# Patient Record
Sex: Male | Born: 1995 | Race: Black or African American | Hispanic: No | Marital: Single | Smoking: Former smoker
Health system: Southern US, Community
[De-identification: ages and names within clinical notes are randomized; demographics above are authoritative.]

---
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
## 2015-02-23 ENCOUNTER — Emergency Department
Admission: EM | Admit: 2015-02-23 | Discharge: 2015-02-23 | Disposition: A | Discharge status: Home / Self Care | Payer: Self-pay

## 2015-02-23 NOTE — ED Notes (Signed)
Called in waiting room with no answer 

## 2015-06-26 ENCOUNTER — Encounter (HOSPITAL_COMMUNITY): Payer: Self-pay | Admitting: Emergency Medicine

## 2015-06-26 ENCOUNTER — Emergency Department (HOSPITAL_COMMUNITY)
Admission: EM | Admit: 2015-06-26 | Discharge: 2015-06-26 | Disposition: A | Discharge status: Home / Self Care | Payer: 59 | Attending: Physician Assistant | Admitting: Physician Assistant

## 2015-06-26 DIAGNOSIS — L02416 Cutaneous abscess of left lower limb: Secondary | ICD-10-CM | POA: Diagnosis not present

## 2015-06-26 DIAGNOSIS — F17200 Nicotine dependence, unspecified, uncomplicated: Secondary | ICD-10-CM | POA: Insufficient documentation

## 2015-06-26 DIAGNOSIS — L0291 Cutaneous abscess, unspecified: Secondary | ICD-10-CM

## 2015-06-26 DIAGNOSIS — M25552 Pain in left hip: Secondary | ICD-10-CM | POA: Diagnosis present

## 2015-06-26 MED ORDER — DOXYCYCLINE HYCLATE 100 MG PO CAPS: 100.0000 mg | ORAL_CAPSULE | Freq: Two times a day (BID) | ORAL | Status: DC

## 2015-06-26 MED ORDER — IBUPROFEN 800 MG PO TABS: 800.0000 mg | ORAL_TABLET | Freq: Three times a day (TID) | ORAL | Status: DC

## 2015-06-26 MED ORDER — DOXYCYCLINE HYCLATE 100 MG PO TABS
100.0000 mg | ORAL_TABLET | Freq: Once | ORAL | Status: AC
  Administered 2015-06-26: 100 mg via ORAL
  Filled 2015-06-26: qty 1

## 2015-06-26 NOTE — ED Provider Notes (Signed)
CSN: [id number]     Arrival date & time 06/26/15  1452 History  By signing my name below, I, Placido Sou, attest that this documentation has been prepared under the direction and in the presence of Blayke Pinera C. Shawntell Dixson, PA-C. Electronically Signed: Placido Sou, ED Scribe. 06/26/2015. 5:33 PM.    Chief Complaint  Patient presents with  . Abscess   The history is provided by the patient. No language interpreter was used.    HPI Comments: Jeremiah Smith is a 20 y.o. male who presents to the Emergency Department complaining of a small abscess with pain and irritation on his left lateral hip onset 4 days ago. Pt notes his wound came to a head and he successfully drained it reporting a white/yellow discharge. He reports associated mild redness and pain to the affected region which has been gradually alleviating. Pt rates his pain at 3/10, throbbing, nonradiating. He reports cleaning the wound with antibacterial soap. Patient denies fever/chills, nausea/vomiting, difficulty ambulating, or any other complaints.  History reviewed. No pertinent past medical history. History reviewed. No pertinent past surgical history. No family history on file. Social History  Substance Use Topics  . Smoking status: Current Every Day Smoker  . Smokeless tobacco: None  . Alcohol Use: No    Review of Systems  Constitutional: Negative for fever and chills.  Gastrointestinal: Negative for nausea and vomiting.  Skin: Positive for color change and wound.    Allergies  Review of patient's allergies indicates not on file.  Home Medications   Prior to Admission medications   Medication Sig Start Date End Date Taking? Authorizing Provider  doxycycline (VIBRAMYCIN) 100 MG capsule Take 1 capsule (100 mg total) by mouth 2 (two) times daily. 06/26/15   Soren Lazarz C Cortez Flippen, PA-C  ibuprofen (ADVIL,MOTRIN) 800 MG tablet Take 1 tablet (800 mg total) by mouth 3 (three) times daily. 06/26/15   Kateena Degroote C Rodney Wigger, PA-C   BP 111/64 mmHg   Pulse 64  Temp(Src) 98.3 F (36.8 C) (Oral)  Resp 16  SpO2 99%    Physical Exam  Constitutional: He is oriented to person, place, and time. He appears well-developed and well-nourished.  HENT:  Head: Normocephalic and atraumatic.  Eyes: Conjunctivae are normal.  Neck: Normal range of motion. Neck supple.  Cardiovascular: Normal rate.   Pulmonary/Chest: Effort normal. No respiratory distress.  Musculoskeletal: Normal range of motion.  Neurological: He is alert and oriented to person, place, and time.  Skin: Skin is warm and dry. There is erythema.  4 cm circular area of erythema and induration on the left lateral hip; no drainage noted or able to be expressed; center of the wound is open with granulation tissue forming in the base of the wound.  Psychiatric: He has a normal mood and affect.  Nursing note and vitals reviewed.   ED Course  Procedures   EMERGENCY DEPARTMENT US SOFT TISSUE INTERPRETATION "Study: Limited Ultrasound of the noted body part in comments below"  INDICATIONS: Soft tissue infection Multiple views of the body part are obtained with a multi-frequency linear probe  PERFORMED BY:  Myself  IMAGES ARCHIVED?: No  SIDE:Left  BODY PART:Lower extremity  FINDINGS: No abcess noted and Cellulitis present  LIMITATIONS: None  INTERPRETATION:  No abcess noted and Cellulitis present  COMMENT:  Depth is superficial. Evidence of cellulitis is present, but minimal.  DIAGNOSTIC STUDIES: Oxygen Saturation is 100% on RA, normal by my interpretation.    COORDINATION OF CARE: 5:30 PM Discussed next steps with pt.  He understood and is agreeable with the plan.   Labs Review Labs Reviewed - No data to display  Imaging Review No results found.   EKG Interpretation None      MDM   Final diagnoses:  Abscess    Jeremiah Smith presents with a small abscess on his left lateral hip for the last 4 days.  This patient's abscess has orally been opened and  drained. No area of fluctuance that would benefit from I&D. Patient placed on an antibiotic to assist healing. Patient is nontoxic appearing, afebrile, not tachycardic on my exam, not tachypneic, and is in no apparent distress. Patient has no signs of sepsis or other serious or life-threatening condition. Suspect that the patient's first pulse was erroneous finding. The patient was given instructions for home care as well as return precautions. Patient voices understanding of these instructions, accepts the plan, and is comfortable with discharge.  Filed Vitals:   06/26/15 1504 06/26/15 1804  BP: 124/67 111/64  Pulse: 114 64  Temp: 98.3 F (36.8 C)   TempSrc: Oral   Resp: 18 16  SpO2: 100% 99%     I personally performed the services described in this documentation, which was scribed in my presence. The recorded information has been reviewed and is accurate.   Anselm Pancoast, PA-C 06/26/15 1946  Courteney Randall An, MD 06/26/15 2246

## 2015-06-26 NOTE — ED Notes (Signed)
Per pt, states abscess on left thigh-noticed it on Monday-states he tried to drain it-states now draining puss

## 2015-06-26 NOTE — Discharge Instructions (Signed)
You have been seen today for an abscess. Abscess seems to be draining well on its own. You have no signs of systemic infection. Please take all of your antibiotics until finished!   You may develop abdominal discomfort or diarrhea from the antibiotic.  You may help offset this with probiotics which you can buy or get in yogurt. Do not eat or take the probiotics until 2 hours after your antibiotic. Follow up with PCP as needed. Return to ED should symptoms worsen.   Emergency Department Resource Guide 1) Find a Doctor and Pay Out of Pocket Although you won't have to find out who is covered by your insurance plan, it is a good idea to ask around and get recommendations. You will then need to call the office and see if the doctor you have chosen will accept you as a new patient and what types of options they offer for patients who are self-pay. Some doctors offer discounts or will set up payment plans for their patients who do not have insurance, but you will need to ask so you aren't surprised when you get to your appointment.  2) Contact Your Local Health Department Not all health departments have doctors that can see patients for sick visits, but many do, so it is worth a call to see if yours does. If you don't know where your local health department is, you can check in your phone book. The CDC also has a tool to help you locate your state's health department, and many state websites also have listings of all of their local health departments.  3) Find a Walk-in Clinic If your illness is not likely to be very severe or complicated, you may want to try a walk in clinic. These are popping up all over the country in pharmacies, drugstores, and shopping centers. They're usually staffed by nurse practitioners or physician assistants that have been trained to treat common illnesses and complaints. They're usually fairly quick and inexpensive. However, if you have serious medical issues or chronic medical  problems, these are probably not your best option.  No Primary Care Doctor: - Call Health Connect at  [phone] - they can help you locate a primary care doctor that  accepts your insurance, provides certain services, etc. - Physician Referral Service- [phone]  Chronic Pain Problems: Organization         Address  Phone   Notes  [facility]  [phone] Patients need to be referred by their primary care doctor.   Medication Assistance: Organization         Address  Phone   Notes  [facility] Medication [facility] [address] [place], [place] [address] [phone] --Must be a resident of [facility] -- Must have NO insurance coverage whatsoever (no Medicaid/ Medicare, etc.) -- The pt. MUST have a primary care doctor that directs their care regularly and follows them in the community   [facility]ist  [phone]   [employer]  [phone]    Agencies that provide inexpensive medical care: Organization         Address  Phone   Notes  [facility]  [phone]   [facility]    [phone]   [facility] [address] [place], [place] [address] [phone]   [facility] [address] [place] [place] [phone]   Planned Parenthood    [phone]  [facility]    [phone]   [facility]  [address], [place] Phone:  [phone], Fax:  [phone] Hours of Operation:  9 am - 6 pm, M-F.  Also accepts Medicaid/Medicare and self-pay.  [facility]  [address], [place] Phone: [phone], Fax: [phone]. Hours of Operation:  8:30 am - 5:30 pm, M-F.  Also accepts Medicaid and self-pay.  [facility] [place] [address] [place] Phone: [phone]   [facility] [address] Natasha Bence [place], [place] [phone], Ext. [phone]  Mondays & Thursdays: 7-9 AM.  First 15 patients are seen on a first come, first serve basis.    Medicaid-accepting [facility] Providers:  Organization         Address  Phone   Notes  [facility] [address], [place] [phone] Also accepts self-pay patients.  [facility] [address] Laurell Josephs [place], [place]  [phone]   [facility] [address], [place] [phone]   [facility] [address] [place] [phone]   Renaye Rakers [address], [place]   [phone] Only accepts [place] Access [place] patients after they have their name applied to their card.   Self-Pay (no insurance) in [facility]:  Organization         Address  Phone   Notes  Sickle Cell Patients, [facility] Medicine [address] [place], [place] [phone]   [facility] [address] [place], [place] [phone]   [facility] [place]  [address], [place] [phone]   [facility]/Dr. Osei-Bonsu  [address], Home or [address], [place] [phone] Phone number for both [place] and [place] locations is the same.  [facility] [address], [place] [phone]   [facility] [address] [place] or [address] [phone]   [facility] [address] [place] [phone], phone; [phone], fax Sees patients 1st and 3rd Saturday of every month.  Must not qualify for public or private insurance (i.e. Medicaid, Medicare, [place] Health Choice, Veterans' Benefits)  Household income should be no more than 200% of the poverty level The clinic cannot treat you if you are pregnant or think you are pregnant  Sexually transmitted diseases are not treated at  the clinic.    Dental Care: Organization         Address  Phone  Notes  [facility] of [facility] [address] [place], [place] [phone] Accepts children up to age 74 who are enrolled in [place] or [place] Health Choice; pregnant women with a Medicaid card; and children who have applied for Medicaid or [place] Health Choice, but were declined, whose parents can pay a reduced fee at time of service.  [facility] Department of [facility]  [address], [place] [phone] Accepts children up to age 50 who are enrolled in [place] or [place] Health Choice; pregnant women with a Medicaid card; and children who have applied for Medicaid or [place] Health Choice, but were declined, whose parents can pay a reduced fee at time  of service.  [facility]  [address], [place] [phone] Patients are seen by appointment only. Walk-ins are not accepted. [facility] will see patients 10518 years of age and older. Monday - Tuesday (8am-5pm) Most Wednesdays (8:30-5pm) $30 per visit, cash only  [facility]  [address], [facility] [phone] Patients are seen by appointment only. Walk-ins are not accepted. [facility] will see patients 20 years of age and older. One Wednesday Evening (Monthly: Volunteer Based).  $30 per visit, cash only  [employer] [facility]  [phone] for adults; Children under age 614, call Graduate Pediatric Dentistry at [phone]. Children aged 634-14, please call [phone]) [phone] to request a pediatric application.  Dental services are provided in all areas of dental care including fillings, crowns and bridges, complete and partial dentures, implants, gum treatment, root canals, and extractions. Preventive care is also provided. Treatment is provided to both adults and children. Patients are selected via a lottery and there is often a  waiting list.   [facility] [address], [place]  [phone] [email or web address]   [facility] [address], [place], [place] [phone], Ext. [phone] Second and Fourth Thursday of each month, opens at 6:30 AM; Clinic ends at 9 AM.  Patients are seen on a first-come first-served basis, and a limited number are seen during each clinic.   [facility]  [address], [place], [place] [phone]   Eligibility Requirements You must have lived in [place], [place], or [place] counties for at least the last three months.   You cannot be eligible for state or federal sponsored [place]healthcare insurance, including CIGNAVeterans Administration, [place]icaid, or [employer]icare.   You generally cannot be eligible for healthcare insurance through your employer.    How to apply: Eligibility screenings are held every Tuesday and Wednesday afternoon from 1:00 pm until 4:00 pm. You do not need an appointment for the interview!  [facility] [address], [place], [place] [phone]   [facility]  [phone]   [facility]  [phone]   [facility]  [phone]    Behavioral Health Resources in the Community: Intensive Outpatient Programs Organization         Address  Phone  Notes  [facility] [address], [place], [place] [phone]   [facility] [address], [place], [place] [phone]   [facility] [address], [place], [place]  [phone]   [facility] [address],  [place], [place] [phone] or [phone]   Substance Abuse Resources Organization         Address  Phone  Notes  [facility] Drug Services  [phone]   [facility]  [phone]   [facility]  [phone]   [facility]  [phone]   Residential & Outpatient Substance Abuse  Program  [phone]   Psychological Services Organization         Address  Phone  Notes  [facility]  [phone]   [facility]  [phone]- [phone]   [facility] [address], [place] [phone] or [phone]    Mobile Crisis Teams Organization         Address  Phone  Notes  [facility], Mobile Crisis Care Unit  [phone]   Assertive Psychotherapeutic  Services  [address] [place], [place]   [facility] [address], [place] [phone]    Self-Help/Support Groups Organization         Address  Phone             Notes  [place]. of [place] - variety of support groups  [place] Call for more information  Narcotics Anonymous (NA), [facility] [address], [employer] [place]  2 meetings at this location   Special educational needs teacher         Address  Phone  Notes  [facility]idential [facility] [place],    [place]  [phone]   [facility]  [address] [place] [address]48016, [place], [place]   [place], [place] [phone] Admissions: 8am-3pm M-F  Incentives Substance [place] [address]    [place], [place] [phone]   [facility] [address] [place], [place], [place]   [facility] [address],  [place], [place]   [facility] - Intensive Outpatient [address] Kristeen Mans [address], [place], [place]   [facility] [place].,  [place], [place] [phone] or [phone]   [facility] ([facility]) [address] [place], [place] Accepts Medicaid  [facility] [address],  [place] [phone] Substance Abuse/Addiction Treatment   [facility] Organization          Address  Phone  Notes  [facility]  [phone]   Domenic Schwab, PhD [address] Arlis [place], [place]   [phone] or [phone]   [place], [place] [phone]   [facility] [address], [place], [place] [phone] Insurance/Medicaid/sponsorship through [facility] [address] [place]                                    [place], [place] [phone] [place] [address], [place] [phone]    Dr. Adele Schilder  [phone]   [facility]. 1) [address] [place] 2) [place] 3)  [place] [address], [place] [phone]  [phone]   [place] [phone] or [phone] (After Hours)

## 2016-04-23 ENCOUNTER — Emergency Department (HOSPITAL_COMMUNITY)
Admission: EM | Admit: 2016-04-23 | Discharge: 2016-04-23 | Disposition: A | Discharge status: Home / Self Care | Payer: 59 | Attending: Emergency Medicine | Admitting: Emergency Medicine

## 2016-04-23 ENCOUNTER — Encounter (HOSPITAL_COMMUNITY): Payer: Self-pay | Admitting: Emergency Medicine

## 2016-04-23 DIAGNOSIS — Z79899 Other long term (current) drug therapy: Secondary | ICD-10-CM | POA: Insufficient documentation

## 2016-04-23 DIAGNOSIS — J028 Acute pharyngitis due to other specified organisms: Secondary | ICD-10-CM | POA: Diagnosis not present

## 2016-04-23 DIAGNOSIS — J029 Acute pharyngitis, unspecified: Secondary | ICD-10-CM | POA: Insufficient documentation

## 2016-04-23 DIAGNOSIS — Z87891 Personal history of nicotine dependence: Secondary | ICD-10-CM | POA: Insufficient documentation

## 2016-04-23 LAB — RAPID STREP SCREEN (MED CTR MEBANE ONLY): Streptococcus, Group A Screen (Direct): NEGATIVE

## 2016-04-23 NOTE — Discharge Instructions (Signed)
Please read attached information. If you experience any new or worsening signs or symptoms please return to the emergency room for evaluation. Please follow-up with your primary care provider or specialist as discussed.  °

## 2016-04-23 NOTE — ED Provider Notes (Signed)
[facility]-EMERGENCY DEPT Provider Note   CSN: [id number] Arrival date & time: 04/23/16  1455  By signing my name below, I, Vista Minkobert Ross, attest that this documentation has been prepared under the direction and in the presence of H&R BlockJeffrey Clarice Bonaventure PA-C.  Electronically Signed: Vista Minkobert Ross, ED Scribe. 04/23/16. 3:38 PM.   History   Chief Complaint Chief Complaint  Patient presents with  . Sore Throat  . Chills    HPI HPI Comments: Jeremiah Smith is a 20 y.o. male who presents to the Emergency Department complaining of sore throat and chills with associated pain while swallowing that started yesterday. Pt reports a burning sensation in the back of his throat with exacerbating pain when swallowing. He also reports multiple episodes of nausea and vomiting today. Pt repots nausea following and food or drink other than water. He took Tylenol after he started to feel febrile and reports mild relief of symptoms, last dose of Tylenol at 1330. Pt denies any rhinorrhea, congestion or cough.  The history is provided by the patient. No language interpreter was used.    History reviewed. No pertinent past medical history.  There are no active problems to display for this patient.   History reviewed. No pertinent surgical history.     Home Medications    Prior to Admission medications   Medication Sig Start Date End Date Taking? Authorizing Provider  doxycycline (VIBRAMYCIN) 100 MG capsule Take 1 capsule (100 mg total) by mouth 2 (two) times daily. 06/26/15   Shawn C Joy, PA-C  ibuprofen (ADVIL,MOTRIN) 800 MG tablet Take 1 tablet (800 mg total) by mouth 3 (three) times daily. 06/26/15   Anselm PancoastShawn C Joy, PA-C    Family History Family History  Problem Relation Age of Onset  . Cancer Father   . Diabetes Father   . Hypertension Father     Social History Social History  Substance Use Topics  . Smoking status: Former Smoker    Types: Cigarettes  . Smokeless tobacco: Never Used  . Alcohol use  Yes     Comment: occ     Allergies   Patient has no known allergies.   Review of Systems Review of Systems A complete 10 system review of systems was obtained and all systems are negative except as noted in the HPI and PMH.    Physical Exam Updated Vital Signs BP 135/68 (BP Location: Right Arm)   Pulse 85   Temp 99.6 F (37.6 C) (Oral)   Resp 18   Wt 200 lb (90.7 kg)   SpO2 95%   Physical Exam  Constitutional: He is oriented to person, place, and time. He appears well-developed and well-nourished. No distress.  HENT:  Head: Normocephalic and atraumatic.  Mouth/Throat: No oropharyngeal exudate.  Mild erythema to posterior oropharynx and tonsils. Bilateral tonsialr enlargement, symmetrical, no exudate, no uvula swelling. No pooling of secretions.   Neck: Normal range of motion.  Neck is supple. Bilateral anterior cervical lymphadenopathy.  Pulmonary/Chest: Effort normal and breath sounds normal. No respiratory distress. He has no wheezes. He has no rales.  Lymphadenopathy:    He has cervical adenopathy.  Neurological: He is alert and oriented to person, place, and time.  Skin: Skin is warm and dry. He is not diaphoretic.  Psychiatric: He has a normal mood and affect. Judgment normal.  Nursing note and vitals reviewed.    ED Treatments / Results  DIAGNOSTIC STUDIES: Oxygen Saturation is 95% on RA, normal by my interpretation.  COORDINATION OF CARE: 3:36  PM-Discussed treatment plan with pt at bedside and pt agreed to plan.   Labs (all labs ordered are listed, but only abnormal results are displayed) Labs Reviewed  R[facility]ID ST[facility]CREEN (NOT [facility])  CULTURE, GROUP A STREP [facility])    EKG  EKG Interpretation None       Radiology No results found.  Procedures Procedures (including critical care time)  Medications Ordered in ED Medications - No data to display   Initial Impression / Assessment and Plan / ED Course  I have reviewed the triage vital signs  and the nursing notes.  Pertinent labs & imaging results that were available during my care of the patient were reviewed by me and considered in my medical decision making (see chart for details).  Clinical Course     Patient presents with likely viral pharyngitis. He has signs of minor pharyngitis, he has no swelling or edema they would be concerning for PTA or RPA. Patient be discharged home with symptomatic care instructions and strict impressions.  Final Clinical Impressions(s) / ED Diagnoses   Final diagnoses:  Viral pharyngitis    New Prescriptions Discharge Medication List as of 04/23/2016  4:06 PM    I personally performed the services described in this documentation, which was scribed in my presence. The recorded information has been reviewed and is accurate.     Eyvonne MechanicJeffrey Haydon Dorris, PA-C 04/23/16 2016    Donnetta HutchingBrian Cook, MD 04/29/16 [phone]

## 2016-04-23 NOTE — ED Triage Notes (Signed)
Pt c/o  Sore throat, chills and gagging x 24 hours. Pt reports fever , treated with Tylenol. Last dosage 1330. Pt reports throat pain and intermittent vomitting. Denies cough

## 2016-04-26 LAB — CULTURE, GROUP A STREP (THRC)

## 2018-01-11 ENCOUNTER — Other Ambulatory Visit: Payer: Self-pay

## 2018-01-11 ENCOUNTER — Encounter (HOSPITAL_COMMUNITY): Payer: Self-pay | Admitting: Emergency Medicine

## 2018-01-11 ENCOUNTER — Emergency Department (HOSPITAL_COMMUNITY)
Admission: EM | Admit: 2018-01-11 | Discharge: 2018-01-12 | Disposition: A | Discharge status: Home / Self Care | Payer: 59 | Attending: Emergency Medicine | Admitting: Emergency Medicine

## 2018-01-11 DIAGNOSIS — R1110 Vomiting, unspecified: Secondary | ICD-10-CM | POA: Diagnosis not present

## 2018-01-11 DIAGNOSIS — Z87891 Personal history of nicotine dependence: Secondary | ICD-10-CM | POA: Diagnosis not present

## 2018-01-11 DIAGNOSIS — R509 Fever, unspecified: Secondary | ICD-10-CM | POA: Diagnosis not present

## 2018-01-11 DIAGNOSIS — J069 Acute upper respiratory infection, unspecified: Secondary | ICD-10-CM | POA: Diagnosis not present

## 2018-01-11 DIAGNOSIS — J029 Acute pharyngitis, unspecified: Secondary | ICD-10-CM | POA: Diagnosis not present

## 2018-01-11 DIAGNOSIS — R0602 Shortness of breath: Secondary | ICD-10-CM | POA: Diagnosis present

## 2018-01-11 NOTE — ED Triage Notes (Signed)
Pt arriving with sore throat and shortness of breath. Pt reports symptoms have been present for approx 3 days. Pt also reports episodes of emesis. Pt has been taking Tylenol extra strength at home with no relief.

## 2018-01-11 NOTE — ED Notes (Signed)
ED Provider at bedside. 

## 2018-01-12 ENCOUNTER — Emergency Department (HOSPITAL_COMMUNITY): Payer: 59

## 2018-01-12 LAB — GROUP A STREP BY PCR: Group A Strep by PCR: NOT DETECTED

## 2018-01-12 MED ORDER — IBUPROFEN 800 MG PO TABS: 800.0000 mg | ORAL_TABLET | Freq: Three times a day (TID) | ORAL | 0 refills | Status: AC

## 2018-01-12 MED ORDER — PHENOL 1.4 % MT LIQD: 1.0000 | OROMUCOSAL | 0 refills | Status: AC | PRN

## 2018-01-12 NOTE — ED Provider Notes (Signed)
[facility]-EMERGENCY DEPT Provider Note   CSN: [id number] Arrival date & time: 01/11/18  2305     History   Chief Complaint Chief Complaint  Patient presents with  . Shortness of Breath  . Sore Throat    HPI Jeremiah Smith is a 22 y.o. male.  The history is provided by the patient and medical records.  Shortness of Breath   Sore Throat  Associated symptoms include shortness of breath.   22 year old male presenting to the ED with sore throat, fever, and intermittent shortness of breath for the past 3 days.  He denies any.  No sick contacts.  Reports he did have one episode of emesis prior to arrival.  Denies abdominal pain or diarrhea.  Has been taking extra strength Tylenol at home without relief.  History reviewed. No pertinent past medical history.  There are no active problems to display for this patient.   History reviewed. No pertinent surgical history.      Home Medications    Prior to Admission medications   Medication Sig Start Date End Date Taking? Authorizing Provider  doxycycline (VIBRAMYCIN) 100 MG capsule Take 1 capsule (100 mg total) by mouth 2 (two) times daily. 06/26/15   Joy, Shawn C, PA-C  ibuprofen (ADVIL,MOTRIN) 800 MG tablet Take 1 tablet (800 mg total) by mouth 3 (three) times daily. 06/26/15   Joy, Hillard Danker, PA-C    Family History Family History  Problem Relation Age of Onset  . Cancer Father   . Diabetes Father   . Hypertension Father     Social History Social History   Tobacco Use  . Smoking status: Former Smoker    Types: Cigarettes  . Smokeless tobacco: Never Used  Substance Use Topics  . Alcohol use: Yes    Comment: occ  . Drug use: Not on file     Allergies   Patient has no known allergies.   Review of Systems Review of Systems  Respiratory: Positive for shortness of breath.   All other systems reviewed and are negative.    Physical Exam Updated Vital Signs BP 132/71 (BP Location: Right  Arm)   Pulse 98   Temp (!) 100.6 F (38.1 C) (Oral)   Resp 18   SpO2 100%   Physical Exam  Constitutional: He is oriented to person, place, and time. He appears well-developed and well-nourished.  HENT:  Head: Normocephalic and atraumatic.  Right Ear: Tympanic membrane and ear canal normal.  Left Ear: Tympanic membrane and ear canal normal.  Nose: Nose normal.  Mouth/Throat: Uvula is midline, oropharynx is clear and moist and mucous membranes are normal.  Tonsils 1+ bilaterally with some small exudates; uvula midline without evidence of peritonsillar abscess; handling secretions appropriately; no difficulty swallowing or speaking; normal phonation without stridor  Eyes: Pupils are equal, round, and reactive to light. Conjunctivae and EOM are normal.  Neck: Normal range of motion.  Cardiovascular: Normal rate, regular rhythm and normal heart sounds.  Pulmonary/Chest: Effort normal and breath sounds normal. He has no decreased breath sounds. He has no wheezes. He has no rales.  Lungs clear, no distress  Abdominal: Soft. Bowel sounds are normal.  Musculoskeletal: Normal range of motion.  Neurological: He is alert and oriented to person, place, and time.  Skin: Skin is warm and dry.  Psychiatric: He has a normal mood and affect.  Nursing note and vitals reviewed.    ED Treatments / Results  Labs (all labs ordered are listed, but  only abnormal results are displayed) Labs Reviewed  GROUP A STREP BY PCR    EKG None  Radiology Dg Chest 2 View  Result Date: 01/12/2018 CLINICAL DATA:  Shortness of breath, fever,sore throat, nausea, vomiting,weakness for three days EXAM: CHEST - 2 VIEW COMPARISON:  None. FINDINGS: The heart size and mediastinal contours are within normal limits. Both lungs are clear. The visualized skeletal structures are unremarkable. IMPRESSION: No active cardiopulmonary disease. Electronically Signed   By: Burman Nieves M.D.   On: 01/12/2018 00:26     Procedures Procedures (including critical care time)  Medications Ordered in ED Medications - No data to display   Initial Impression / Assessment and Plan / ED Course  I have reviewed the triage vital signs and the nursing notes.  Pertinent labs & imaging results that were available during my care of the patient were reviewed by me and considered in my medical decision making (see chart for details).  22 year old male here with intermittent shortness of breath, sore throat, and fever.  He has a low-grade fever here but is nontoxic in appearance.  Tonsils are mildly edematous with some small areas of exudate, handling secretions well.  Normal phonation without stridor.  No uvular deviation to suggest peritonsillar abscess or deep space infection of the neck.  Lungs are clear without any wheezes or rhonchi.  Chest x-ray is negative.  Rapid strep screen is also negative.  Suspect this is likely viral process.  We will plan to discharge home with symptomatic care.  Close follow-up with PCP.  Return here for any new/acute changes.  At time of discharge, patient inquired about HIV testing.  States he has no actual concern or exposure to HIV, just wants to be screened.  I recommended that he can have this done at the health department.  Final Clinical Impressions(s) / ED Diagnoses   Final diagnoses:  Viral URI    ED Discharge Orders         Ordered    phenol (CHLORASEPTIC) 1.4 % LIQD  As needed     01/12/18 0137    ibuprofen (ADVIL,MOTRIN) 800 MG tablet  3 times daily     01/12/18 0137           Garlon Hatchet, PA-C 01/12/18 2683    Derwood Kaplan, MD 01/12/18 [phone]

## 2018-01-12 NOTE — Discharge Instructions (Signed)
Your chest x-ray and throat swab wer  normal.  This is likely viral.  Can last up to 10-14 days. Take the prescribed medication as directed. Follow-up with the health dept if you would like STD testing including HIV. Return to the ED for new or worsening symptoms.

## 2020-01-04 IMAGING — CR DG CHEST 2V
2 series · 2 of 2 positions shown · non-contrast
Comparison: None.

CLINICAL DATA: Shortness of breath, fever,sore throat, nausea,
vomiting,weakness for three days

EXAM:
CHEST - 2 VIEW

[w chest pa]
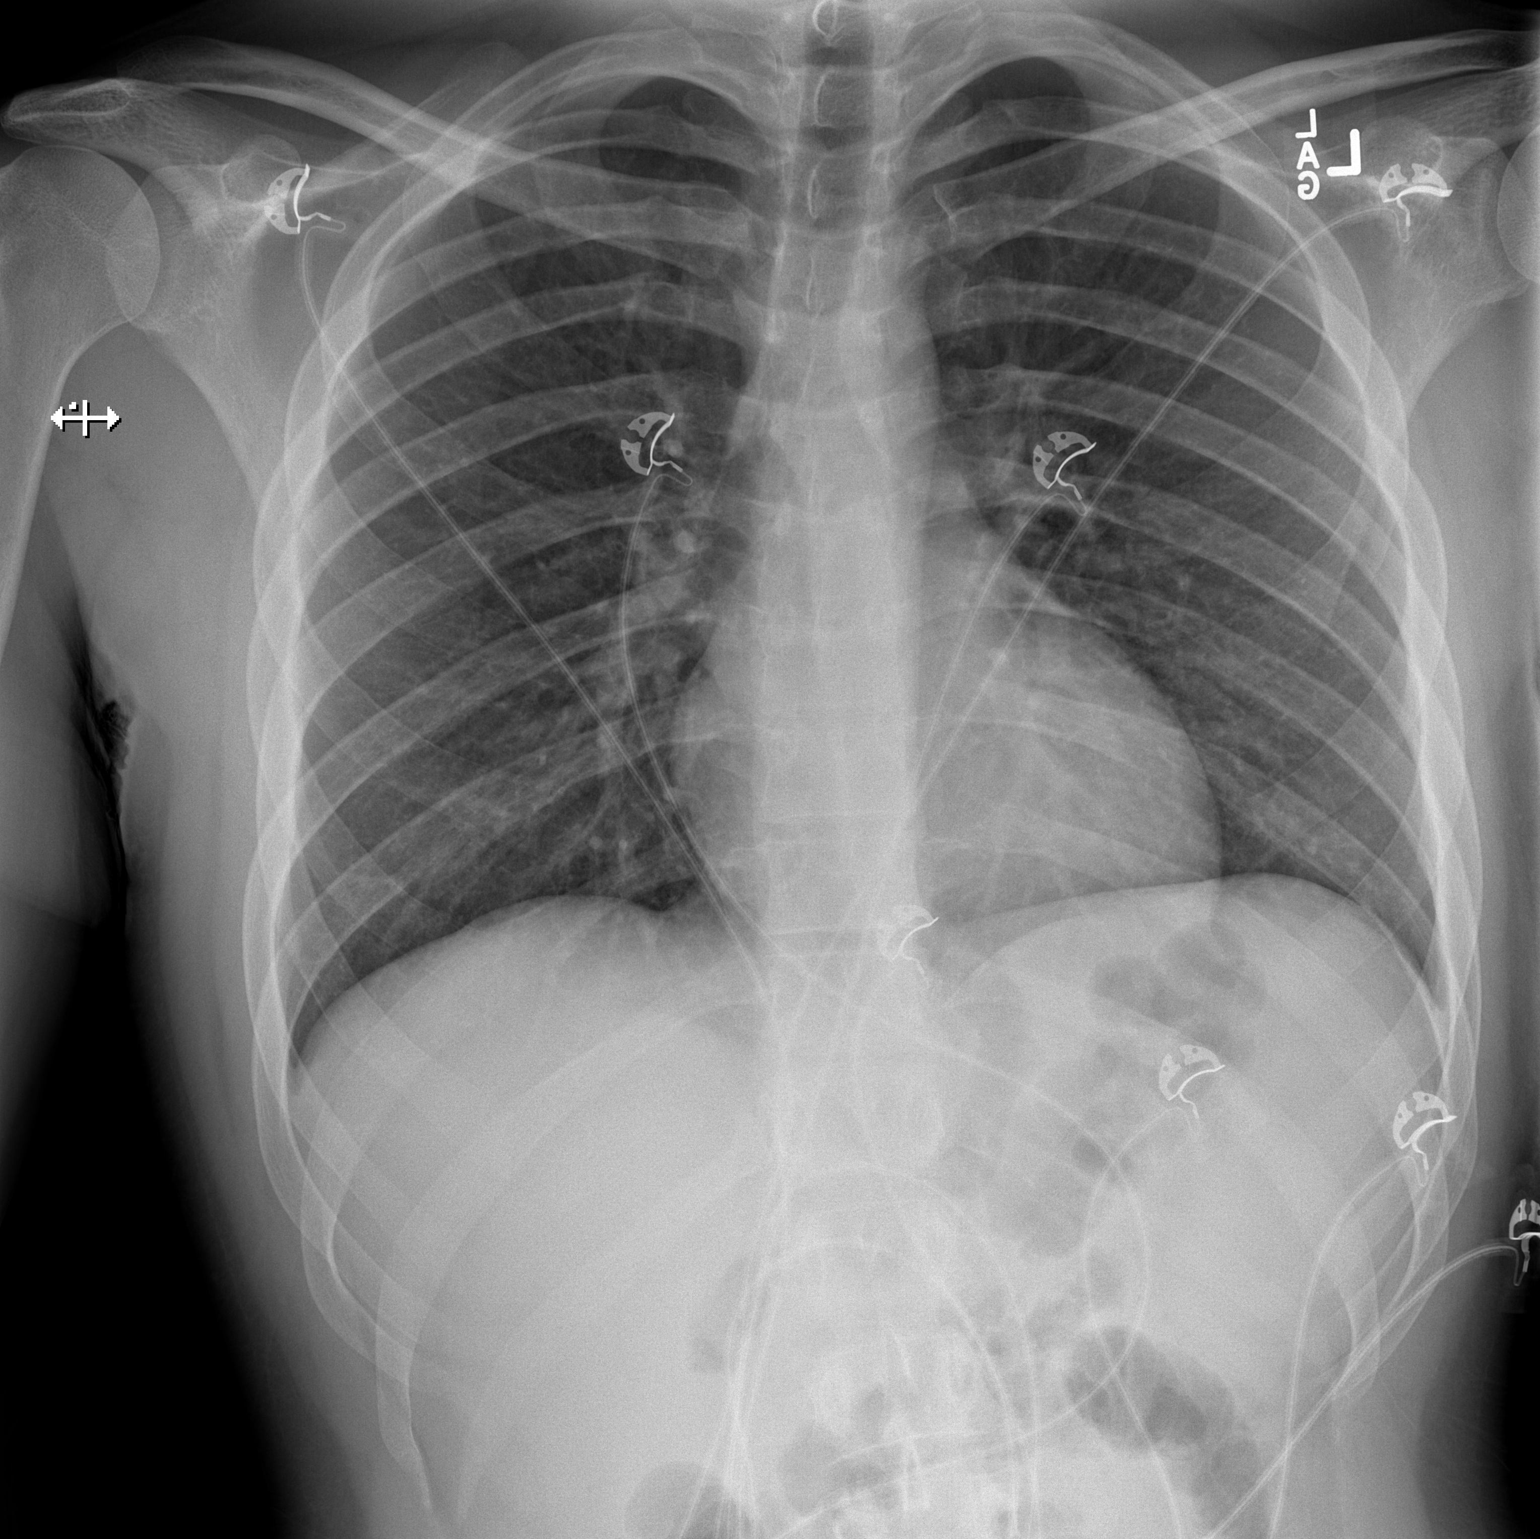

[w chest lat]
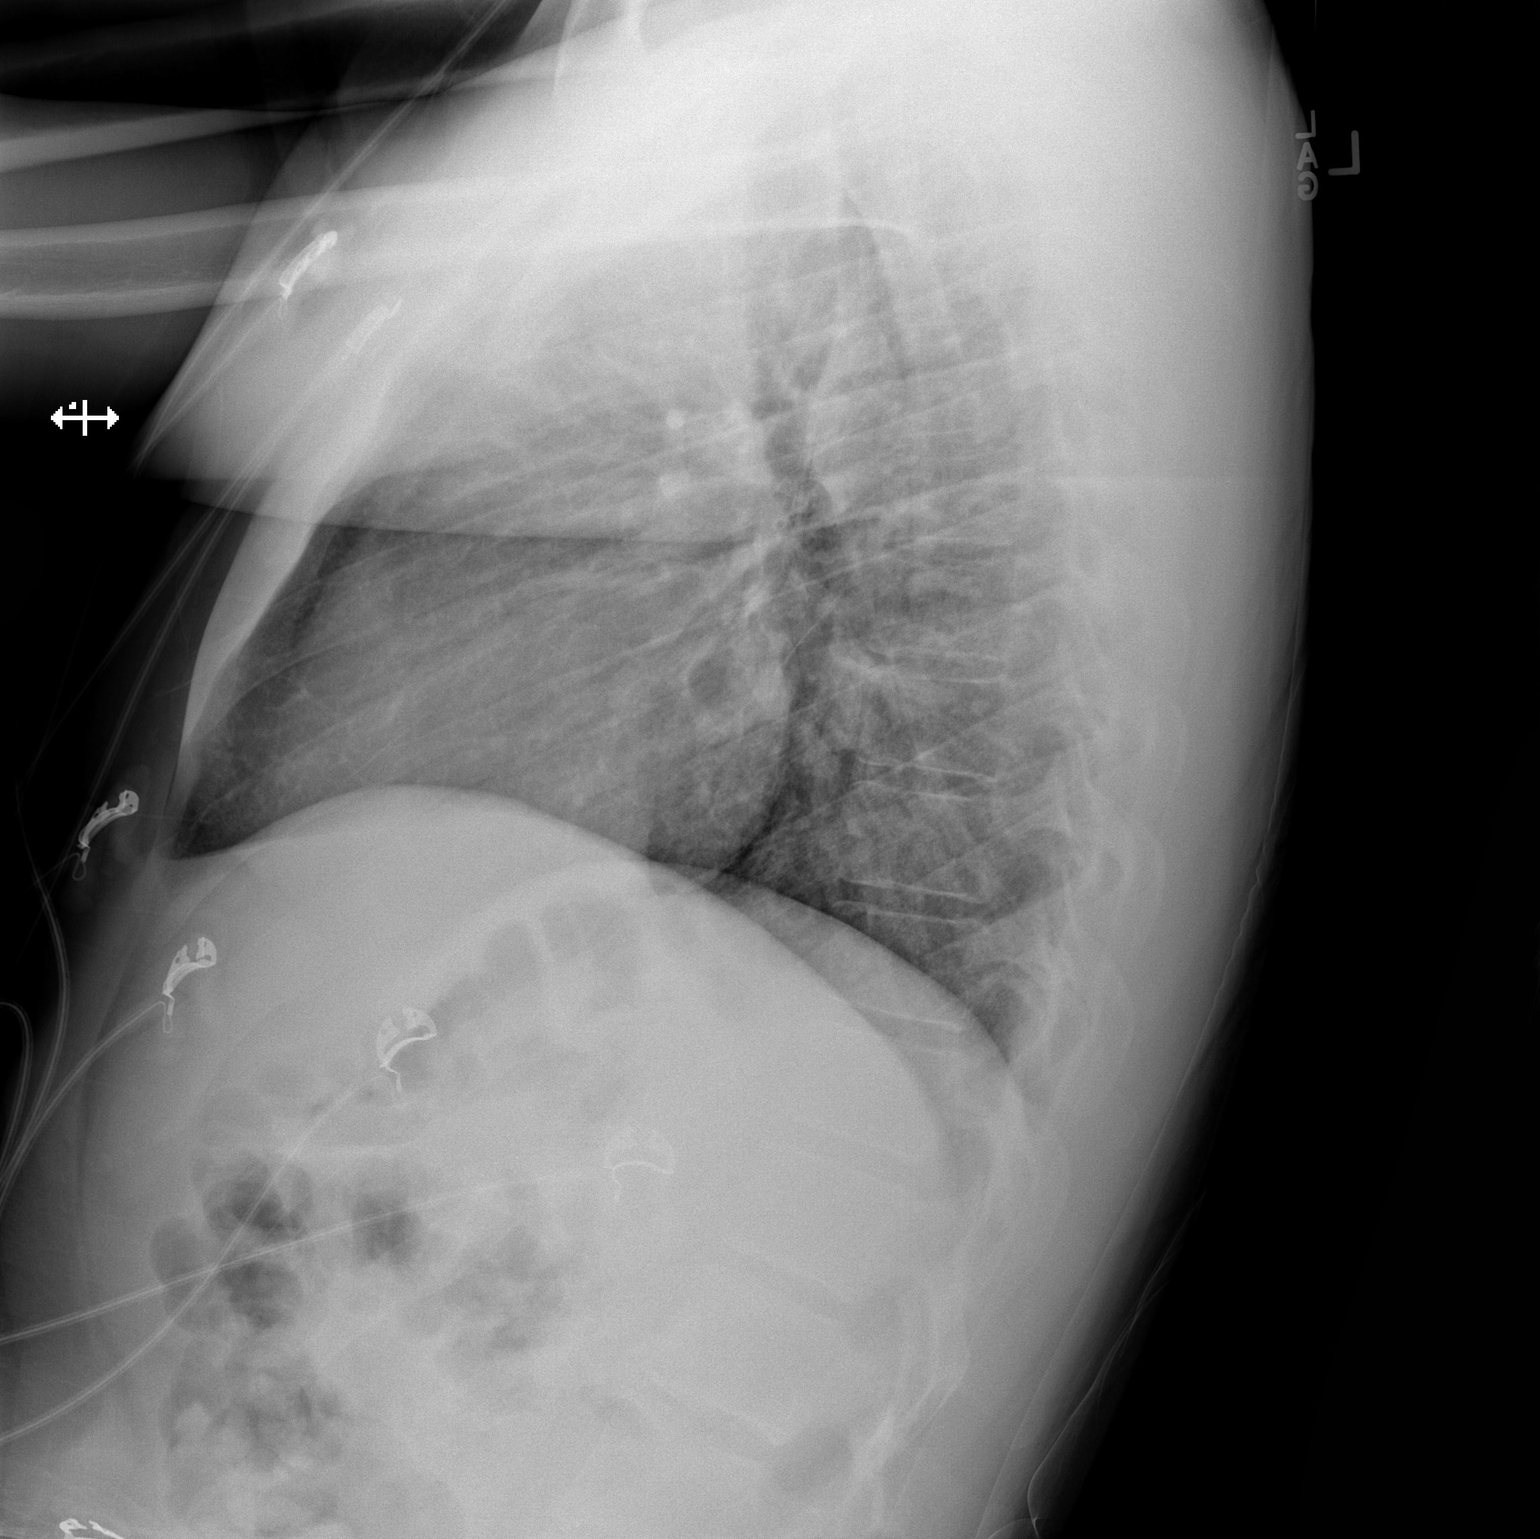

[2 of 2 positions shown; findings below may reference images not displayed]

FINDINGS: The heart size and mediastinal contours are within normal limits.
Both lungs are clear. The visualized skeletal structures are
unremarkable.
IMPRESSION: No active cardiopulmonary disease.

## 2022-03-04 ENCOUNTER — Other Ambulatory Visit: Payer: Self-pay

## 2022-03-04 MED ORDER — PHENTERMINE HCL 37.5 MG PO TABS
ORAL_TABLET | ORAL | 0 refills | Status: DC
  Filled 2022-03-04: qty 30, 30d supply, fill #0

## 2022-03-09 ENCOUNTER — Other Ambulatory Visit: Payer: Self-pay

## 2022-03-11 ENCOUNTER — Other Ambulatory Visit: Payer: Self-pay

## 2022-04-08 ENCOUNTER — Other Ambulatory Visit: Payer: Self-pay

## 2022-04-08 MED ORDER — PHENTERMINE HCL 37.5 MG PO TABS
37.5000 mg | ORAL_TABLET | Freq: Every morning | ORAL | 0 refills | Status: DC
  Filled 2022-04-08: qty 30, 30d supply, fill #0

## 2022-05-12 ENCOUNTER — Other Ambulatory Visit: Payer: Self-pay

## 2022-05-12 DIAGNOSIS — E6609 Other obesity due to excess calories: Secondary | ICD-10-CM | POA: Diagnosis not present

## 2022-05-12 DIAGNOSIS — Z6832 Body mass index (BMI) 32.0-32.9, adult: Secondary | ICD-10-CM | POA: Diagnosis not present

## 2022-05-12 MED ORDER — PHENTERMINE HCL 37.5 MG PO TABS
ORAL_TABLET | ORAL | 0 refills | Status: AC
  Filled 2022-05-12: qty 30, 30d supply, fill #0

## 2022-07-21 ENCOUNTER — Ambulatory Visit: Payer: Self-pay | Admitting: Nurse Practitioner

## 2023-02-14 ENCOUNTER — Ambulatory Visit
Admission: EM | Admit: 2023-02-14 | Discharge: 2023-02-14 | Disposition: A | Discharge status: Home / Self Care | Payer: 59 | Attending: Emergency Medicine | Admitting: Emergency Medicine

## 2023-02-14 DIAGNOSIS — J069 Acute upper respiratory infection, unspecified: Secondary | ICD-10-CM | POA: Diagnosis not present

## 2023-02-14 LAB — POCT RAPID STREP A (OFFICE): Rapid Strep A Screen: NEGATIVE

## 2023-02-14 MED ORDER — ACETAMINOPHEN 325 MG PO TABS
650.0000 mg | ORAL_TABLET | Freq: Once | ORAL | Status: AC
  Administered 2023-02-14: 650 mg via ORAL

## 2023-02-14 NOTE — Discharge Instructions (Signed)
.  Your symptoms today are most likely being caused by a virus and should steadily improve in time it can take up to 7 to 10 days before you truly start to see a turnaround however things will get better  Rapid strep test is negative for bacteria, has been sent to the lab to determine if bacteria will grow, if this occurs you will be notified and antibiotics sent to pharmacy    You can take Tylenol and/or Ibuprofen as needed for fever reduction and pain relief.   For cough: honey 1/2 to 1 teaspoon (you can dilute the honey in water or another fluid).  You can also use guaifenesin and dextromethorphan for cough. You can use a humidifier for chest congestion and cough.  If you don't have a humidifier, you can sit in the bathroom with the hot shower running.      For sore throat: try warm salt water gargles, cepacol lozenges, throat spray, warm tea or water with lemon/honey, popsicles or ice, or OTC cold relief medicine for throat discomfort.   For congestion: take a daily anti-histamine like Zyrtec, Claritin, and a oral decongestant, such as pseudoephedrine.  You can also use Flonase 1-2 sprays in each nostril daily.   It is important to stay hydrated: drink plenty of fluids (water, gatorade/powerade/pedialyte, juices, or teas) to keep your throat moisturized and help further relieve irritation/discomfort.

## 2023-02-14 NOTE — ED Provider Notes (Signed)
[facility]    CSN: [id number] Arrival date & time: 02/14/23  1056      History   Chief Complaint Chief Complaint  Patient presents with   Fever   Sore Throat   Lymphadenopathy    HPI Jeremiah Smith is a 27 y.o. male.   Patient presents for evaluation of fever, body aches, sore throat, swollen glands and generalized weakness beginning 1 day ago.  Painful to swallow, decreased appetite but able to tolerate fluids.  Attempted to manage symptoms with Tylenol.  Home COVID testing negative.  No known sick contacts prior.  Denies congestion, cough, abdominal symptoms.  History reviewed. No pertinent past medical history.  There are no problems to display for this patient.   History reviewed. No pertinent surgical history.     Home Medications    Prior to Admission medications   Medication Sig Start Date End Date Taking? Authorizing Provider  ibuprofen (ADVIL,MOTRIN) 800 MG tablet Take 1 tablet (800 mg total) by mouth 3 (three) times daily. 01/12/18   Garlon Hatchet, PA-C  phenol (CHLORASEPTIC) 1.4 % LIQD Use as directed 1 spray in the mouth or throat as needed for throat irritation / pain. Patient not taking: Reported on 02/14/2023 01/12/18   Garlon Hatchet, PA-C  phentermine (ADIPEX-P) 37.5 MG tablet Take 1 tablet (37.5 mg total) by mouth every morning before breakfast 05/12/22       Family History Family History  Problem Relation Age of Onset   Cancer Father    Diabetes Father    Hypertension Father     Social History Social History   Tobacco Use   Smoking status: Former    Types: Cigarettes   Smokeless tobacco: Never  Vaping Use   Vaping status: Every Day  Substance Use Topics   Alcohol use: Not Currently    Comment: occ     Allergies   Patient has no known allergies.   Review of Systems Review of Systems   Physical Exam Triage Vital Signs ED Triage Vitals  Encounter Vitals Group     BP 02/14/23 1109 116/79     Systolic BP Percentile  --      Diastolic BP Percentile --      Pulse Rate 02/14/23 1109 (!) 105     Resp 02/14/23 1109 19     Temp 02/14/23 1109 (!) 102 F (38.9 C)     Temp Source 02/14/23 1109 Oral     SpO2 02/14/23 1109 95 %     Weight --      Height --      Head Circumference --      Peak Flow --      Pain Score 02/14/23 1106 8     Pain Loc --      Pain Education --      Exclude from Growth Chart --    No data found.  Updated Vital Signs BP 116/79 (BP Location: Left Arm)   Pulse (!) 105   Temp (!) 102 F (38.9 C) (Oral)   Resp 19   SpO2 95%   Visual Acuity Right Eye Distance:   Left Eye Distance:   Bilateral Distance:    Right Eye Near:   Left Eye Near:    Bilateral Near:     Physical Exam Constitutional:      Appearance: Normal appearance. He is well-developed.  HENT:     Right Ear: Tympanic membrane and ear canal normal.     Left  Ear: Tympanic membrane and ear canal normal.     Nose: No congestion or rhinorrhea.     Mouth/Throat:     Pharynx: No oropharyngeal exudate or posterior oropharyngeal erythema.     Tonsils: No tonsillar exudate. 0 on the right. 0 on the left.  Cardiovascular:     Rate and Rhythm: Normal rate and regular rhythm.     Pulses: Normal pulses.     Heart sounds: Normal heart sounds.  Pulmonary:     Effort: Pulmonary effort is normal.     Breath sounds: Normal breath sounds.  Abdominal:     General: Bowel sounds are normal.     Palpations: Abdomen is soft.  Musculoskeletal:     Cervical back: Normal range of motion.  Lymphadenopathy:     Cervical: Cervical adenopathy present.  Skin:    General: Skin is warm and dry.  Neurological:     General: No focal deficit present.     Mental Status: He is alert and oriented to person, place, and time.      UC Treatments / Results  Labs (all labs ordered are listed, but only abnormal results are displayed) Labs Reviewed  CULTURE, GROUP A STREP [facility])  POCT RAPID STREP A (OFFICE)     EKG   Radiology No results found.  Procedures Procedures (including critical care time)  Medications Ordered in UC Medications  acetaminophen (TYLENOL) tablet 650 mg (650 mg Oral Given 02/14/23 1115)    Initial Impression / Assessment and Plan / UC Course  I have reviewed the triage vital signs and the nursing notes.  Pertinent labs & imaging results that were available during my care of the patient were reviewed by me and considered in my medical decision making (see chart for details).  Viral URI  Patient is in no signs of distress nor toxic appearing.  Vital signs are stable.  Low suspicion for pneumonia, pneumothorax or bronchitis and therefore will defer imaging.  Rapid strep test negative, sent for culture.May use over-the-counter medications as needed for supportive care.  May follow-up with urgent care as needed if symptoms persist or worsen.  Note given.   Final Clinical Impressions(s) / UC Diagnoses   Final diagnoses:  Viral URI     Discharge Instructions      .Your symptoms today are most likely being caused by a virus and should steadily improve in time it can take up to 7 to 10 days before you truly start to see a turnaround however things will get better  Rapid strep test is negative for bacteria, has been sent to the lab to determine if bacteria will grow, if this occurs you will be notified and antibiotics sent to pharmacy    You can take Tylenol and/or Ibuprofen as needed for fever reduction and pain relief.   For cough: honey 1/2 to 1 teaspoon (you can dilute the honey in water or another fluid).  You can also use guaifenesin and dextromethorphan for cough. You can use a humidifier for chest congestion and cough.  If you don't have a humidifier, you can sit in the bathroom with the hot shower running.      For sore throat: try warm salt water gargles, cepacol lozenges, throat spray, warm tea or water with lemon/honey, popsicles or ice, or OTC cold  relief medicine for throat discomfort.   For congestion: take a daily anti-histamine like Zyrtec, Claritin, and a oral decongestant, such as pseudoephedrine.  You can also use Flonase 1-2 sprays  in each nostril daily.   It is important to stay hydrated: drink plenty of fluids (water, gatorade/powerade/pedialyte, juices, or teas) to keep your throat moisturized and help further relieve irritation/discomfort.      ED Prescriptions   None    PDMP not reviewed this encounter.   Valinda Hoar, NP 02/14/23 [phone]

## 2023-02-14 NOTE — ED Triage Notes (Signed)
Patient to Urgent Care with complaints of fevers/ weakness/ sore throat/ swollen lymph nodes/ generalized body aches.   Reports symptoms started yesterday. Tylenol at 5:30 am.   Denies any sick known contacts. Negative home Covid test.

## 2023-02-16 LAB — CULTURE, GROUP A STREP (THRC)
